# Patient Record
Sex: Male | Born: 2000 | Race: Black or African American | Hispanic: No | Marital: Single | Smoking: Never smoker
Health system: Southern US, Community
[De-identification: ages and names within clinical notes are randomized; demographics above are authoritative.]

## PROBLEM LIST (undated history)

## (undated) DIAGNOSIS — T7840XA Allergy, unspecified, initial encounter: Secondary | ICD-10-CM

## (undated) DIAGNOSIS — Z8489 Family history of other specified conditions: Secondary | ICD-10-CM

## (undated) HISTORY — PX: INGUINAL HERNIA REPAIR: SUR1180

---
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
## 2019-07-30 ENCOUNTER — Ambulatory Visit: Payer: Medicaid Other | Attending: Internal Medicine

## 2019-07-30 DIAGNOSIS — Z20822 Contact with and (suspected) exposure to covid-19: Secondary | ICD-10-CM

## 2019-07-31 ENCOUNTER — Telehealth: Payer: Self-pay | Admitting: *Deleted

## 2019-07-31 LAB — NOVEL CORONAVIRUS, NAA: SARS-CoV-2, NAA: DETECTED — AB

## 2019-07-31 NOTE — Telephone Encounter (Signed)
Patient is requesting a letter with that states he should self isolate until results are pended,patient says he is showing covid symptoms .He would like this sent to his my chart ,please call him [phone].

## 2019-08-01 NOTE — Telephone Encounter (Signed)
Pt called for quarantine instructions; instructions given to pt: All persons with fever and respiratory symptoms should isolate themselves until ALL conditions listed below are met: - at least 10 days since symptoms onset - AND 3 consecutive days fever free without antipyretics (acetaminophen [Tylenol] or ibuprofen [Advil]) - AND improvement in respiratory symptoms Pt also notified that he received a message via MyChart, and his results can be printed from this app; he verbalized understanding

## 2020-09-22 ENCOUNTER — Other Ambulatory Visit: Payer: Self-pay

## 2020-09-22 ENCOUNTER — Emergency Department (HOSPITAL_COMMUNITY)
Admission: EM | Admit: 2020-09-22 | Discharge: 2020-09-22 | Disposition: A | Discharge status: Home / Self Care | Payer: Medicaid Other | Attending: Emergency Medicine | Admitting: Emergency Medicine

## 2020-09-22 ENCOUNTER — Encounter (HOSPITAL_COMMUNITY): Payer: Self-pay

## 2020-09-22 ENCOUNTER — Emergency Department (HOSPITAL_COMMUNITY): Payer: Medicaid Other

## 2020-09-22 DIAGNOSIS — I10 Essential (primary) hypertension: Secondary | ICD-10-CM | POA: Diagnosis not present

## 2020-09-22 DIAGNOSIS — R0789 Other chest pain: Secondary | ICD-10-CM

## 2020-09-22 DIAGNOSIS — R072 Precordial pain: Secondary | ICD-10-CM | POA: Diagnosis present

## 2020-09-22 LAB — CBC
HCT: 49.6 % (ref 39.0–52.0)
Hemoglobin: 16.4 g/dL (ref 13.0–17.0)
MCH: 29.1 pg (ref 26.0–34.0)
MCHC: 33.1 g/dL (ref 30.0–36.0)
MCV: 87.9 fL (ref 80.0–100.0)
Platelets: 248 10*3/uL (ref 150–400)
RBC: 5.64 MIL/uL (ref 4.22–5.81)
RDW: 13.4 % (ref 11.5–15.5)
WBC: 5.5 10*3/uL (ref 4.0–10.5)
nRBC: 0 % (ref 0.0–0.2)

## 2020-09-22 LAB — BASIC METABOLIC PANEL
Anion gap: 9 (ref 5–15)
BUN: 17 mg/dL (ref 6–20)
CO2: 27 mmol/L (ref 22–32)
Calcium: 9.8 mg/dL (ref 8.9–10.3)
Chloride: 101 mmol/L (ref 98–111)
Creatinine, Ser: 1.11 mg/dL (ref 0.61–1.24)
GFR, Estimated: >60 mL/min (ref >60)
Glucose, Bld: 95 mg/dL (ref 70–99)
Potassium: 4.2 mmol/L (ref 3.5–5.1)
Sodium: 137 mmol/L (ref 135–145)

## 2020-09-22 LAB — HEPATIC FUNCTION PANEL
ALT: 20 U/L (ref 0–44)
AST: 20 U/L (ref 15–41)
Albumin: 4.6 g/dL (ref 3.5–5.0)
Alkaline Phosphatase: 95 U/L (ref 38–126)
Bilirubin, Direct: <0.1 mg/dL (ref 0.0–0.2)
Total Bilirubin: 0.7 mg/dL (ref 0.3–1.2)
Total Protein: 7.8 g/dL (ref 6.5–8.1)

## 2020-09-22 LAB — TROPONIN I (HIGH SENSITIVITY): Troponin I (High Sensitivity): <2 ng/L (ref <18)

## 2020-09-22 LAB — LIPASE, BLOOD: Lipase: 29 U/L (ref 11–51)

## 2020-09-22 NOTE — ED Triage Notes (Signed)
Pt arrived via walk in, c/o mid sternal chest pain, radiating to back that started approx 2 hrs ago. Pain worsening with deep breathing or mvmt.

## 2020-09-22 NOTE — ED Notes (Signed)
An After Visit Summary was printed and given to the patient. Discharge instructions given and no further questions at this time.  

## 2020-09-22 NOTE — Discharge Instructions (Addendum)
While in the ED your blood pressure was high.  Please follow up with your primary care doctor or the wellness clinic for repeat evaluation as you may need medication.  High blood pressure can cause long term, potentially serious, damage if left untreated.   Please take Ibuprofen (Advil, motrin) and Tylenol (acetaminophen) to relieve your pain.    You may take up to 600 MG (3 pills) of normal strength ibuprofen every 8 hours as needed.   You make take tylenol, up to 1,000 mg (two extra strength pills) every 8 hours as needed.   It is safe to take ibuprofen and tylenol at the same time as they work differently.   Do not take more than 3,000 mg tylenol in a 24 hour period (not more than one dose every 8 hours.  Please check all medication labels as many medications such as pain and cold medications may contain tylenol.  Do not drink alcohol while taking these medications.  Do not take other NSAID'S while taking ibuprofen (such as aleve or naproxen).  Please take ibuprofen with food to decrease stomach upset.

## 2020-09-22 NOTE — ED Provider Notes (Signed)
[facility]-EMERGENCY DEPT Provider Note   CSN: [id number] Arrival date & time: 09/22/20  1446     History Chief Complaint  Patient presents with  . Chest Pain    Darren Hartman is a 20 y.o. male with no pertinent past medical history presents today for evaluation of chest and back pain.  He states that when he went to bed last night he felt fine and normal however today he woke up with substernal chest pain radiating into his back.  He denies any fevers.  He states that he feels mildly short of breath.  He is not vaccinated against Covid however did have Covid about 1 year ago.  He denies any leg swelling, no recent fevers, surgeries, hemoptysis or immobilizations.  He denies any family history of coagulopathy or MI before the age of 57.  He states that his pain is made worse with movement, made better with being still. 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  He denies any specific injury. He denies nausea, vomiting, or diarrhea.  He denies any known sick contacts.  No coughing or nasal congestion.  He denies any history of similar symptoms.  No interventions tried prior to arrival.  HPI     Past Medical History:  Diagnosis Date  . Allergy    seasonal  . Family history of adverse reaction to anesthesia    Fathter- slow to awaken    Patient Active Problem List   Diagnosis Date Noted  . BACK PAIN, ACUTE 06/09/2010  . TINEA VERSICOLOR 12/21/2009  . INGUINAL HERNIA 09/20/2006  . JAUNDICE NOS 09/20/2006    Past Surgical History:  Procedure Laterality Date  . INGUINAL HERNIA REPAIR    . KNEE ARTHROSCOPY WITH LATERAL MENISECTOMY Right 12/07/2015   Procedure: KNEE DIAGNOSTIC OPERATIVE ARTHROSCOPY WITH DEBRIDEMENT;  Surgeon: Cammy Copa, MD;  Location: [facility] OR;  Service: Orthopedics;  Laterality: Right;       Family History  Problem Relation Age of Onset  . Hypertension Mother   . Miscarriages / [place] Mother   . COPD  Father   . Diabetes Father   . Heart disease Father   . Hypertension Father   . Asthma Brother   . Diabetes Maternal Grandmother   . Hypertension Maternal Grandmother     Social History   Tobacco Use  . Smoking status: Never Smoker  Substance Use Topics  . Alcohol use: No  . Drug use: No    Home Medications Prior to Admission medications   Medication Sig Start Date End Date Taking? Authorizing Provider  oxyCODONE-acetaminophen (ROXICET) 5-325 MG tablet Take 1 tablet by mouth every 6 (six) hours as needed for severe pain. Patient not taking: No sig reported 12/07/15   Cammy Copa, MD    Allergies    Patient has no known allergies.  Review of Systems   Review of Systems  Constitutional: Negative for chills, diaphoresis, fatigue and fever.  HENT: Negative for congestion.   Eyes: Negative for visual disturbance.  Respiratory: Positive for shortness of breath. Negative for cough.   Cardiovascular: Positive for chest pain. Negative for palpitations and leg swelling.  Gastrointestinal: Negative for abdominal pain, diarrhea, nausea and vomiting.  Musculoskeletal: Positive for back pain. Negative for neck pain.  Skin: Negative for color change and rash.  Neurological: Negative for weakness and headaches.  All other systems reviewed and are negative.   Physical Exam Updated Vital Signs BP (!) 164/78   Pulse 81  Temp 98.4 F (36.9 C) (Oral)   Resp 11   Ht 5\' 8"  (1.727 m)   Wt 92.1 kg   SpO2 100%   BMI 30.87 kg/m   Physical Exam Vitals and nursing note reviewed.  Constitutional:      General: He is not in acute distress.    Appearance: He is not ill-appearing or diaphoretic.  HENT:     Head: Normocephalic and atraumatic.  Eyes:     General: No scleral icterus.       Right eye: No discharge.        Left eye: No discharge.     Conjunctiva/sclera: Conjunctivae normal.  Cardiovascular:     Rate and Rhythm: Normal rate and regular rhythm.     Pulses:           Dorsalis pedis pulses are 2+ on the right side and 2+ on the left side.       Posterior tibial pulses are 2+ on the right side and 2+ on the left side.     Heart sounds: Normal heart sounds. No murmur heard.   Pulmonary:     Effort: Pulmonary effort is normal. No respiratory distress.     Breath sounds: Normal breath sounds. No stridor. No decreased breath sounds.  Chest:     Chest wall: Tenderness present.  Abdominal:     General: There is no distension.     Palpations: Abdomen is soft.     Tenderness: There is no abdominal tenderness.  Musculoskeletal:        General: No deformity.     Cervical back: Normal range of motion.     Right lower leg: No tenderness. No edema.     Left lower leg: No tenderness. No edema.     Comments: Arm movement both recreates and exacerbates his reported chest pain back pain.  Skin:    General: Skin is warm and dry.  Neurological:     Mental Status: He is alert.     Motor: No abnormal muscle tone.  Psychiatric:        Mood and Affect: Mood normal.        Behavior: Behavior normal.     ED Results / Procedures / Treatments   Labs (all labs ordered are listed, but only abnormal results are displayed) Labs Reviewed  BASIC METABOLIC PANEL  CBC  HEPATIC FUNCTION PANEL  LIPASE, BLOOD  TROPONIN I (HIGH SENSITIVITY)  TROPONIN I (HIGH SENSITIVITY)    EKG None  Radiology DG Chest 2 View  Result Date: 09/22/2020 CLINICAL DATA:  Chest pain EXAM: CHEST - 2 VIEW COMPARISON:  None. FINDINGS: The heart size and mediastinal contours are within normal limits. Both lungs are clear. The visualized skeletal structures are unremarkable. IMPRESSION: No active cardiopulmonary disease. Electronically Signed   By: 11/22/2020 M.D.   On: 09/22/2020 15:53    Procedures Procedures   Medications Ordered in ED Medications - No data to display  ED Course  I have reviewed the triage vital signs and the nursing notes.  Pertinent labs & imaging results that  were available during my care of the patient were reviewed by me and considered in my medical decision making (see chart for details).    MDM Rules/Calculators/A&P                         LOXLEY SCHMALE is a 20 year old man who presents today for evaluation of chest pain  radiating into his back.  On exam his pain is recreated with palpation over the anterior chest wall and is additionally recreated and exacerbated with on movements in both his chest and back.  EKG is without ischemia.  Chest x-ray is unremarkable.  CBC, BMP, hepatic function panel, lipase, and troponin are all unremarkable. Based on the onset of symptoms patient does not require a second troponin. Overall low risk, doubt ACS.  He is PERC negative.  He is hypertensive in the emergency room today however given his reassuring work-up I do not suspect that this is the cause of his chest pain today.  I recommended that he get reevaluated by his primary care doctor in the next 1 to 2 weeks for recheck.  Additionally he does not have significant family cardiac history.  Return precautions were discussed with patient who states their understanding.  At the time of discharge patient denied any unaddressed complaints or concerns.  Patient is agreeable for discharge home.  Note: Portions of this report may have been transcribed using voice recognition software. Every effort was made to ensure accuracy; however, inadvertent computerized transcription errors may be present  Final Clinical Impression(s) / ED Diagnoses Final diagnoses:  Atypical chest pain  Hypertension, unspecified type    Rx / DC Orders ED Discharge Orders    None       Cristina Gong, PA-C 09/23/20 0011    Alvira Monday, MD 09/24/20 1705

## 2020-09-22 NOTE — ED Notes (Signed)
sunable to collect blood.

## 2022-06-08 IMAGING — CR DG CHEST 2V
2 series · 2 of 2 positions shown · non-contrast
Comparison: None.

CLINICAL DATA: Chest pain

EXAM:
CHEST - 2 VIEW

[w chest pa]
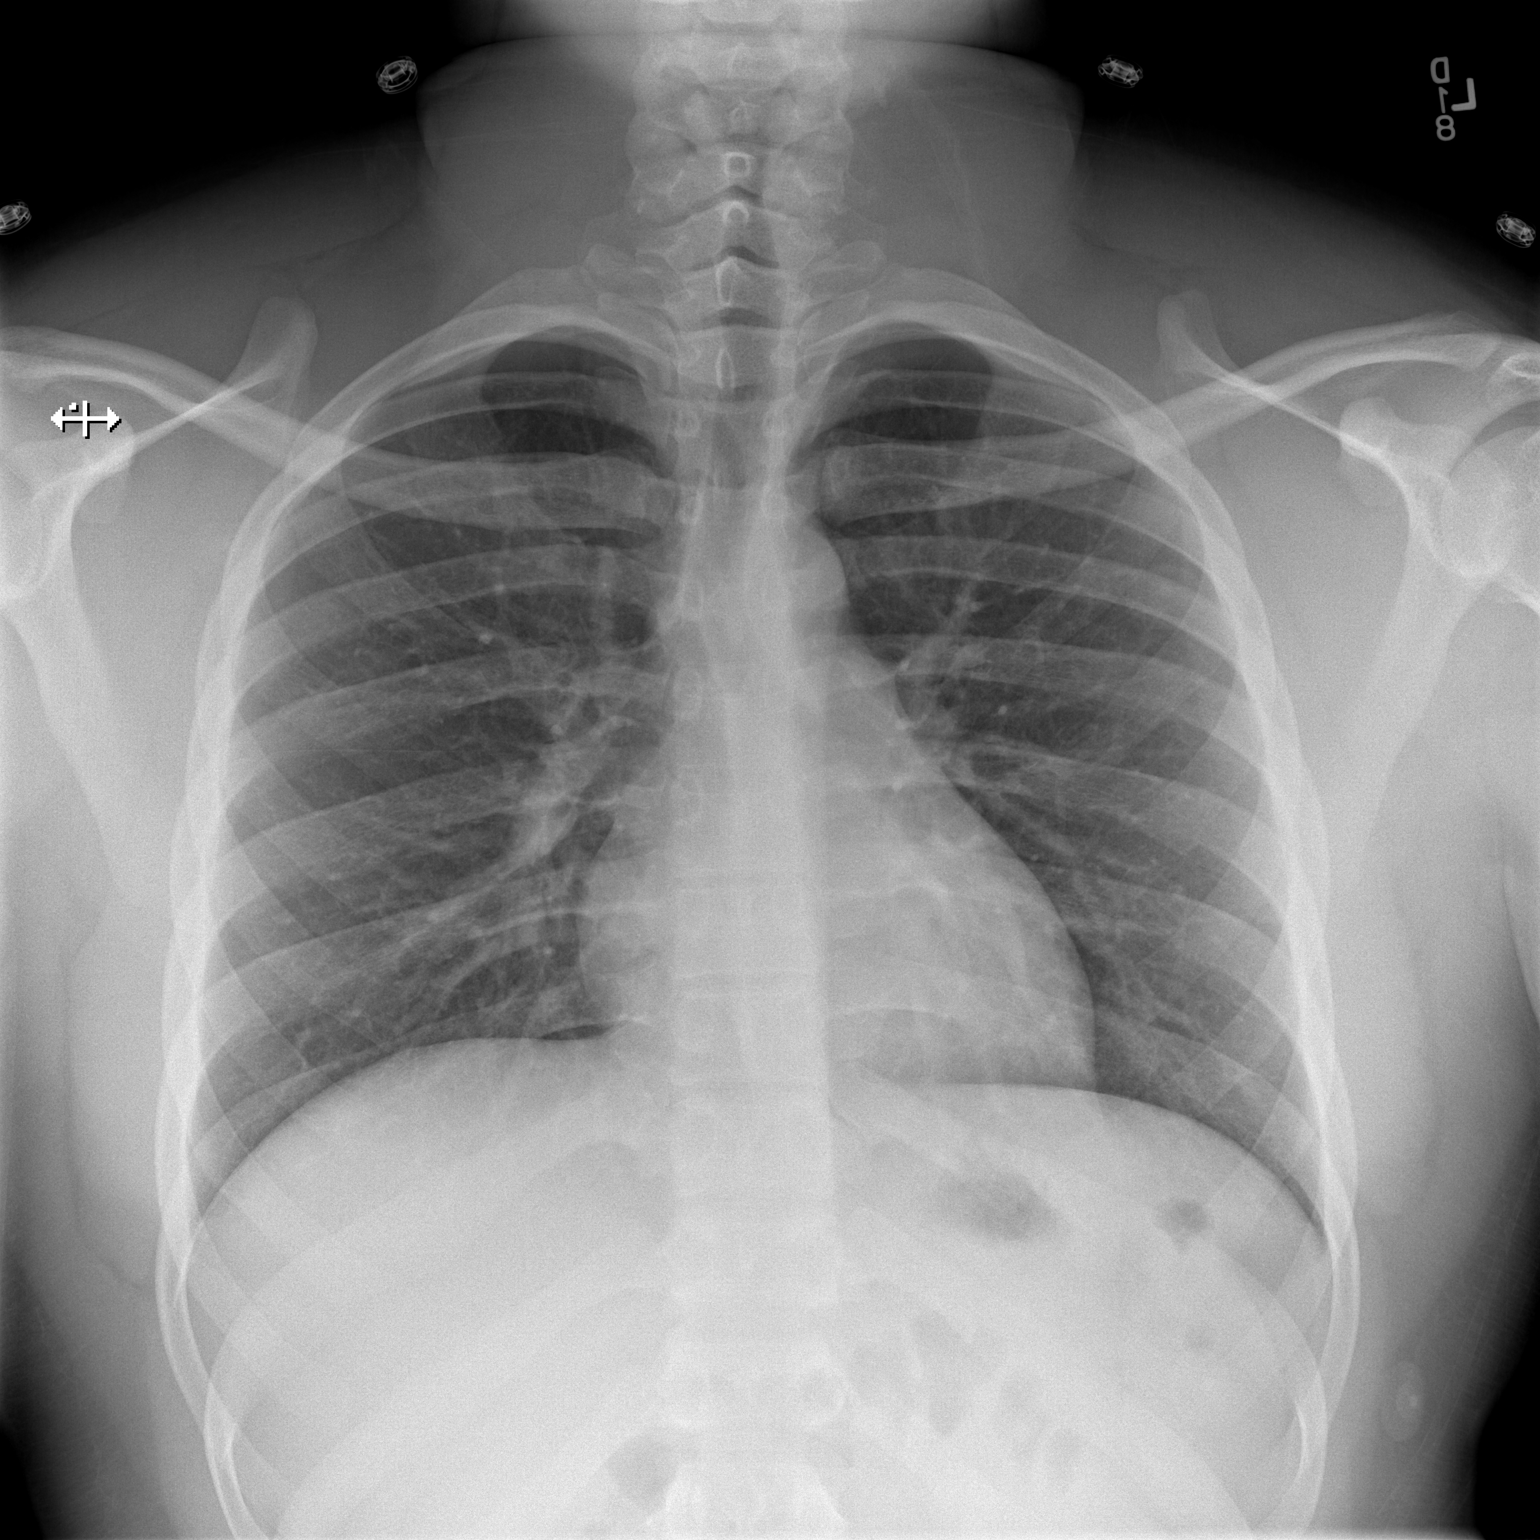

[w chest lat]
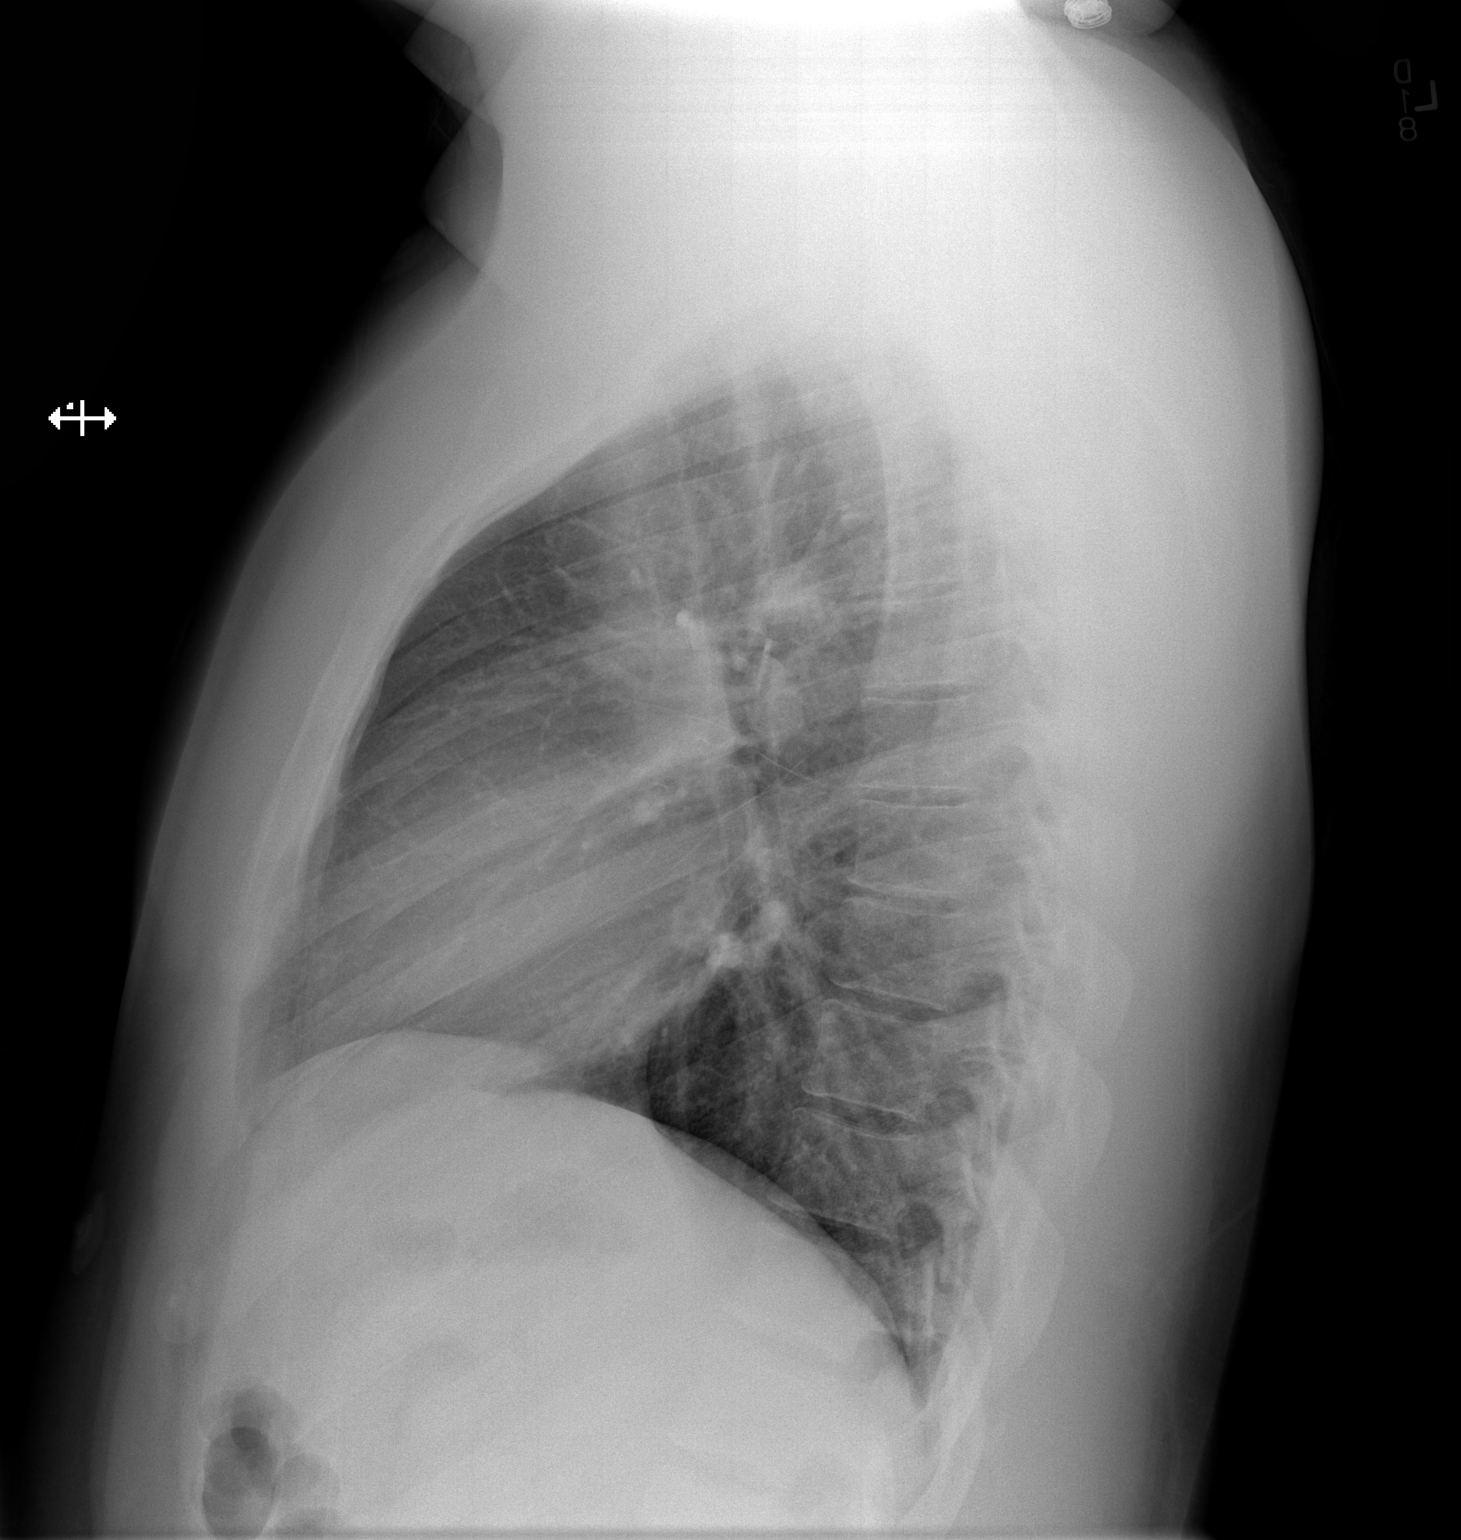

[2 of 2 positions shown; findings below may reference images not displayed]

FINDINGS: The heart size and mediastinal contours are within normal limits.
Both lungs are clear. The visualized skeletal structures are
unremarkable.
IMPRESSION: No active cardiopulmonary disease.

## 2023-05-21 ENCOUNTER — Encounter (HOSPITAL_BASED_OUTPATIENT_CLINIC_OR_DEPARTMENT_OTHER): Payer: Self-pay

## 2023-05-21 ENCOUNTER — Other Ambulatory Visit: Payer: Self-pay

## 2023-05-21 DIAGNOSIS — R112 Nausea with vomiting, unspecified: Secondary | ICD-10-CM | POA: Insufficient documentation

## 2023-05-21 NOTE — ED Triage Notes (Signed)
Pt reports at noon today he developed a headache and had an episode of vomiting after eating lunch. He felt better but then tonight he had another episode of vomiting after eating dinner; associated with abdominal discomfort. Denies diarrhea.

## 2023-05-22 ENCOUNTER — Emergency Department (HOSPITAL_BASED_OUTPATIENT_CLINIC_OR_DEPARTMENT_OTHER)
Admission: EM | Admit: 2023-05-22 | Discharge: 2023-05-22 | Disposition: A | Discharge status: Home / Self Care | Payer: Medicaid Other | Attending: Emergency Medicine | Admitting: Emergency Medicine

## 2023-05-22 DIAGNOSIS — R112 Nausea with vomiting, unspecified: Secondary | ICD-10-CM

## 2023-05-22 LAB — CBC
HCT: 44.7 % (ref 39.0–52.0)
Hemoglobin: 15.1 g/dL (ref 13.0–17.0)
MCH: 28.9 pg (ref 26.0–34.0)
MCHC: 33.8 g/dL (ref 30.0–36.0)
MCV: 85.5 fL (ref 80.0–100.0)
Platelets: 276 10*3/uL (ref 150–400)
RBC: 5.23 MIL/uL (ref 4.22–5.81)
RDW: 13.3 % (ref 11.5–15.5)
WBC: 8.3 10*3/uL (ref 4.0–10.5)
nRBC: 0 % (ref 0.0–0.2)

## 2023-05-22 LAB — URINALYSIS, ROUTINE W REFLEX MICROSCOPIC
Bilirubin Urine: NEGATIVE
Glucose, UA: NEGATIVE mg/dL
Ketones, ur: NEGATIVE mg/dL
Leukocytes,Ua: NEGATIVE
Nitrite: NEGATIVE
Protein, ur: 100 mg/dL — AB
Specific Gravity, Urine: 1.025 (ref 1.005–1.030)
pH: 7 (ref 5.0–8.0)

## 2023-05-22 LAB — COMPREHENSIVE METABOLIC PANEL
ALT: 26 U/L (ref 0–44)
AST: 25 U/L (ref 15–41)
Albumin: 4.6 g/dL (ref 3.5–5.0)
Alkaline Phosphatase: 100 U/L (ref 38–126)
Anion gap: 12 (ref 5–15)
BUN: 17 mg/dL (ref 6–20)
CO2: 25 mmol/L (ref 22–32)
Calcium: 9.1 mg/dL (ref 8.9–10.3)
Chloride: 99 mmol/L (ref 98–111)
Creatinine, Ser: 1.04 mg/dL (ref 0.61–1.24)
GFR, Estimated: >60 mL/min (ref >60)
Glucose, Bld: 98 mg/dL (ref 70–99)
Potassium: 3.8 mmol/L (ref 3.5–5.1)
Sodium: 136 mmol/L (ref 135–145)
Total Bilirubin: 0.6 mg/dL (ref 0.3–1.2)
Total Protein: 7.8 g/dL (ref 6.5–8.1)

## 2023-05-22 LAB — URINALYSIS, MICROSCOPIC (REFLEX): WBC, UA: NONE SEEN WBC/hpf (ref 0–5)

## 2023-05-22 LAB — LIPASE, BLOOD: Lipase: 28 U/L (ref 11–51)

## 2023-05-22 MED ORDER — ONDANSETRON 4 MG PO TBDP
8.0000 mg | ORAL_TABLET | Freq: Once | ORAL | Status: AC
  Administered 2023-05-22: 8 mg via ORAL
  Filled 2023-05-22: qty 2

## 2023-05-22 MED ORDER — ONDANSETRON HCL 8 MG PO TABS: 8.0000 mg | ORAL_TABLET | ORAL | 0 refills | Status: AC | PRN

## 2023-05-22 NOTE — Discharge Instructions (Signed)
Begin taking Zofran as prescribed as needed for nausea.  Clear liquid diet for the next 12 hours, then slowly advance to normal as tolerated.  Return to the ER for severe abdominal pain, worsening vomiting, bloody stools, or for other new and concerning symptoms.

## 2023-05-22 NOTE — ED Notes (Signed)
Pt given water for PO challenge 

## 2023-05-22 NOTE — ED Provider Notes (Signed)
[place] [facility] AT [facility] Provider Note   CSN: [id number] Arrival date & time: 05/21/23  2345     History  Chief Complaint  Patient presents with   Emesis    Darren Hartman is a 22 y.o. male.  Patient is a 22 year old male presenting with complaints of vomiting.  Earlier today he had an episode of vomiting.  He ate food, but did not feel as though it went all the way down.  Shortly afterward he threw up.  He had another episode this evening shortly after eating that felt similar.  He denies any fevers or chills.  He denies abdominal pain.  The history is provided by the patient.       Home Medications Prior to Admission medications   Medication Sig Start Date End Date Taking? Authorizing Provider  oxyCODONE-acetaminophen (ROXICET) 5-325 MG tablet Take 1 tablet by mouth every 6 (six) hours as needed for severe pain. Patient not taking: No sig reported 12/07/15   Cammy Copa, MD      Allergies    Patient has no known allergies.    Review of Systems   Review of Systems  All other systems reviewed and are negative.   Physical Exam Updated Vital Signs BP 120/72   Pulse 84   Temp 98.8 F (37.1 C)   Resp 18   Ht 5\' 7"  (1.702 m)   Wt 114.8 kg   SpO2 100%   BMI 39.63 kg/m  Physical Exam Vitals and nursing note reviewed.  Constitutional:      General: He is not in acute distress.    Appearance: He is well-developed. He is not diaphoretic.  HENT:     Head: Normocephalic and atraumatic.  Cardiovascular:     Rate and Rhythm: Normal rate and regular rhythm.     Heart sounds: No murmur heard.    No friction rub.  Pulmonary:     Effort: Pulmonary effort is normal. No respiratory distress.     Breath sounds: Normal breath sounds. No wheezing or rales.  Abdominal:     General: Bowel sounds are normal. There is no distension.     Palpations: Abdomen is soft.     Tenderness: There is no abdominal tenderness.  Musculoskeletal:         General: Normal range of motion.     Cervical back: Normal range of motion and neck supple.  Skin:    General: Skin is warm and dry.  Neurological:     Mental Status: He is alert and oriented to person, place, and time.     Coordination: Coordination normal.     ED Results / Procedures / Treatments   Labs (all labs ordered are listed, but only abnormal results are displayed) Labs Reviewed  URINALYSIS, ROUTINE W REFLEX MICROSCOPIC - Abnormal; Notable for the following components:      Result Value   Hgb urine dipstick SMALL (*)    Protein, ur 100 (*)    All other components within normal limits  URINALYSIS, MICROSCOPIC (REFLEX) - Abnormal; Notable for the following components:   Bacteria, UA RARE (*)    All other components within normal limits  LIPASE, BLOOD  COMPREHENSIVE METABOLIC PANEL  CBC    EKG None  Radiology No results found.  Procedures Procedures    Medications Ordered in ED Medications  ondansetron (ZOFRAN-ODT) disintegrating tablet 8 mg (has no administration in time range)    ED Course/ Medical Decision Making/ A&P  Patient is a 22 year old male presenting with nausea and vomiting as described in the HPI.  Arrives with stable vital signs and is afebrile.  Physical examination unremarkable and abdomen is benign.  CBC, CMP, lipase, and urinalysis obtained, all of which are unremarkable.  There is no leukocytosis or electrolyte derangement.  Patient was given ODT Zofran and has tolerated p.o. in the department without difficulty.  He has had no further vomiting and at the time of my reassessment is resting comfortably.  Patient to be discharged with ODT Zofran and as needed return.  I suspect symptoms are viral or possibly foodborne in nature.  I do not feel as though any imaging studies are indicated at this time.  Final Clinical Impression(s) / ED Diagnoses Final diagnoses:  None    Rx / DC Orders ED Discharge Orders     None          Geoffery Lyons, MD 05/22/23 [phone]
